# Patient Record
Sex: Male | Born: 1990 | Race: Black or African American | Hispanic: No | Marital: Single | Smoking: Current every day smoker
Health system: Southern US, Community
[De-identification: ages and names within clinical notes are randomized; demographics above are authoritative.]

## PROBLEM LIST (undated history)

## (undated) DIAGNOSIS — F319 Bipolar disorder, unspecified: Secondary | ICD-10-CM

## (undated) DIAGNOSIS — F909 Attention-deficit hyperactivity disorder, unspecified type: Secondary | ICD-10-CM

---
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
## 2012-01-01 NOTE — ED Provider Notes (Signed)
Medical screening examination/treatment/procedure(s) were performed by non-physician practitioner and as supervising physician I was immediately available for consultation/collaboration.  Annisha Baar, MD 01/01/12 0057 

## 2021-05-15 ENCOUNTER — Emergency Department (HOSPITAL_COMMUNITY): Payer: No Typology Code available for payment source

## 2021-05-15 ENCOUNTER — Other Ambulatory Visit: Payer: Self-pay

## 2021-05-15 ENCOUNTER — Encounter (HOSPITAL_COMMUNITY): Payer: Self-pay | Admitting: *Deleted

## 2021-05-15 ENCOUNTER — Emergency Department (HOSPITAL_COMMUNITY)
Admission: EM | Admit: 2021-05-15 | Discharge: 2021-05-15 | Disposition: A | Discharge status: Other Acute Inpatient Hospital | Payer: No Typology Code available for payment source | Attending: Emergency Medicine | Admitting: Emergency Medicine

## 2021-05-15 DIAGNOSIS — M25512 Pain in left shoulder: Secondary | ICD-10-CM | POA: Insufficient documentation

## 2021-05-15 DIAGNOSIS — M25562 Pain in left knee: Secondary | ICD-10-CM | POA: Insufficient documentation

## 2021-05-15 DIAGNOSIS — Y92410 Unspecified street and highway as the place of occurrence of the external cause: Secondary | ICD-10-CM | POA: Diagnosis not present

## 2021-05-15 DIAGNOSIS — S299XXA Unspecified injury of thorax, initial encounter: Secondary | ICD-10-CM | POA: Diagnosis not present

## 2021-05-15 DIAGNOSIS — M25572 Pain in left ankle and joints of left foot: Secondary | ICD-10-CM | POA: Insufficient documentation

## 2021-05-15 DIAGNOSIS — M542 Cervicalgia: Secondary | ICD-10-CM | POA: Diagnosis not present

## 2021-05-15 DIAGNOSIS — S0232XA Fracture of orbital floor, left side, initial encounter for closed fracture: Secondary | ICD-10-CM

## 2021-05-15 DIAGNOSIS — Z23 Encounter for immunization: Secondary | ICD-10-CM | POA: Insufficient documentation

## 2021-05-15 DIAGNOSIS — S3991XA Unspecified injury of abdomen, initial encounter: Secondary | ICD-10-CM | POA: Diagnosis not present

## 2021-05-15 DIAGNOSIS — F17210 Nicotine dependence, cigarettes, uncomplicated: Secondary | ICD-10-CM | POA: Diagnosis not present

## 2021-05-15 DIAGNOSIS — S01112A Laceration without foreign body of left eyelid and periocular area, initial encounter: Secondary | ICD-10-CM

## 2021-05-15 DIAGNOSIS — S0592XA Unspecified injury of left eye and orbit, initial encounter: Secondary | ICD-10-CM | POA: Diagnosis present

## 2021-05-15 DIAGNOSIS — Z20822 Contact with and (suspected) exposure to covid-19: Secondary | ICD-10-CM | POA: Insufficient documentation

## 2021-05-15 DIAGNOSIS — T1490XA Injury, unspecified, initial encounter: Secondary | ICD-10-CM

## 2021-05-15 LAB — I-STAT CHEM 8, ED
BUN: 19 mg/dL (ref 6–20)
Calcium, Ion: 1.22 mmol/L (ref 1.15–1.40)
Chloride: 105 mmol/L (ref 98–111)
Creatinine, Ser: 0.9 mg/dL (ref 0.61–1.24)
Glucose, Bld: 103 mg/dL — ABNORMAL HIGH (ref 70–99)
HCT: 49 % (ref 39.0–52.0)
Hemoglobin: 16.7 g/dL (ref 13.0–17.0)
Potassium: 4.5 mmol/L (ref 3.5–5.1)
Sodium: 140 mmol/L (ref 135–145)
TCO2: 29 mmol/L (ref 22–32)

## 2021-05-15 LAB — CBC
HCT: 48 % (ref 39.0–52.0)
Hemoglobin: 15.3 g/dL (ref 13.0–17.0)
MCH: 24.5 pg — ABNORMAL LOW (ref 26.0–34.0)
MCHC: 31.9 g/dL (ref 30.0–36.0)
MCV: 76.9 fL — ABNORMAL LOW (ref 80.0–100.0)
Platelets: 217 10*3/uL (ref 150–400)
RBC: 6.24 MIL/uL — ABNORMAL HIGH (ref 4.22–5.81)
RDW: 17.9 % — ABNORMAL HIGH (ref 11.5–15.5)
WBC: 6.8 10*3/uL (ref 4.0–10.5)
nRBC: 0 % (ref 0.0–0.2)

## 2021-05-15 LAB — RESP PANEL BY RT-PCR (FLU A&B, COVID) ARPGX2
Influenza A by PCR: NEGATIVE
Influenza B by PCR: NEGATIVE
SARS Coronavirus 2 by RT PCR: NEGATIVE

## 2021-05-15 LAB — URINALYSIS, ROUTINE W REFLEX MICROSCOPIC
Bacteria, UA: NONE SEEN
Bilirubin Urine: NEGATIVE
Glucose, UA: NEGATIVE mg/dL
Ketones, ur: NEGATIVE mg/dL
Leukocytes,Ua: NEGATIVE
Nitrite: NEGATIVE
Protein, ur: NEGATIVE mg/dL
RBC / HPF: >50 RBC/hpf — ABNORMAL HIGH (ref 0–5)
Specific Gravity, Urine: 1.032 — ABNORMAL HIGH (ref 1.005–1.030)
pH: 7 (ref 5.0–8.0)

## 2021-05-15 LAB — COMPREHENSIVE METABOLIC PANEL
ALT: 32 U/L (ref 0–44)
AST: 40 U/L (ref 15–41)
Albumin: 4.1 g/dL (ref 3.5–5.0)
Alkaline Phosphatase: 52 U/L (ref 38–126)
Anion gap: 8 (ref 5–15)
BUN: 16 mg/dL (ref 6–20)
CO2: 27 mmol/L (ref 22–32)
Calcium: 9.4 mg/dL (ref 8.9–10.3)
Chloride: 103 mmol/L (ref 98–111)
Creatinine, Ser: 1.01 mg/dL (ref 0.61–1.24)
GFR, Estimated: >60 mL/min (ref >60)
Glucose, Bld: 106 mg/dL — ABNORMAL HIGH (ref 70–99)
Potassium: 4.6 mmol/L (ref 3.5–5.1)
Sodium: 138 mmol/L (ref 135–145)
Total Bilirubin: 0.8 mg/dL (ref 0.3–1.2)
Total Protein: 7.2 g/dL (ref 6.5–8.1)

## 2021-05-15 LAB — SAMPLE TO BLOOD BANK

## 2021-05-15 LAB — PROTIME-INR
INR: 1 (ref 0.8–1.2)
Prothrombin Time: 13.1 seconds (ref 11.4–15.2)

## 2021-05-15 LAB — LACTIC ACID, PLASMA: Lactic Acid, Venous: 1.7 mmol/L (ref 0.5–1.9)

## 2021-05-15 MED ORDER — CEFAZOLIN SODIUM-DEXTROSE 1-4 GM/50ML-% IV SOLN
1.0000 g | Freq: Once | INTRAVENOUS | Status: AC
  Administered 2021-05-15: 1 g via INTRAVENOUS
  Filled 2021-05-15: qty 50

## 2021-05-15 MED ORDER — IBUPROFEN 400 MG PO TABS
400.0000 mg | ORAL_TABLET | Freq: Once | ORAL | Status: AC
  Administered 2021-05-15: 400 mg via ORAL
  Filled 2021-05-15: qty 1

## 2021-05-15 MED ORDER — IOHEXOL 300 MG/ML  SOLN
100.0000 mL | Freq: Once | INTRAMUSCULAR | Status: AC | PRN
  Administered 2021-05-15: 100 mL via INTRAVENOUS

## 2021-05-15 MED ORDER — TETANUS-DIPHTH-ACELL PERTUSSIS 5-2.5-18.5 LF-MCG/0.5 IM SUSY
0.5000 mL | PREFILLED_SYRINGE | Freq: Once | INTRAMUSCULAR | Status: AC
  Administered 2021-05-15: 0.5 mL via INTRAMUSCULAR
  Filled 2021-05-15: qty 0.5
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
## 2021-05-15 NOTE — ED Notes (Signed)
Notified MRI that pt has an appt for laceration repair  and MD requesting his MRI get as soon as possible
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
## 2021-05-15 NOTE — ED Notes (Signed)
Patient remains in xray 

## 2021-05-15 NOTE — ED Notes (Signed)
Patient transported to MRI 
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
